# Patient Record
Sex: Male | Born: 1970 | Race: White | Hispanic: No | Marital: Married | Smoking: Never smoker
Health system: Southern US, Community
[De-identification: ages and names within clinical notes are randomized; demographics above are authoritative.]

## PROBLEM LIST (undated history)

## (undated) ENCOUNTER — Emergency Department: Admission: EM | Discharge status: Absent without leave | Payer: Self-pay

---

## 2017-07-05 ENCOUNTER — Emergency Department (HOSPITAL_COMMUNITY)
Admission: EM | Admit: 2017-07-05 | Discharge: 2017-07-05 | Disposition: A | Discharge status: Home / Self Care | Payer: Managed Care, Other (non HMO) | Attending: Emergency Medicine | Admitting: Emergency Medicine

## 2017-07-05 ENCOUNTER — Emergency Department (HOSPITAL_COMMUNITY): Payer: Managed Care, Other (non HMO)

## 2017-07-05 ENCOUNTER — Encounter (HOSPITAL_COMMUNITY): Payer: Self-pay | Admitting: Emergency Medicine

## 2017-07-05 DIAGNOSIS — S169XXA Unspecified injury of muscle, fascia and tendon at neck level, initial encounter: Secondary | ICD-10-CM | POA: Diagnosis present

## 2017-07-05 DIAGNOSIS — S161XXA Strain of muscle, fascia and tendon at neck level, initial encounter: Secondary | ICD-10-CM | POA: Insufficient documentation

## 2017-07-05 DIAGNOSIS — Y999 Unspecified external cause status: Secondary | ICD-10-CM | POA: Insufficient documentation

## 2017-07-05 DIAGNOSIS — Y92410 Unspecified street and highway as the place of occurrence of the external cause: Secondary | ICD-10-CM | POA: Diagnosis not present

## 2017-07-05 DIAGNOSIS — Y9389 Activity, other specified: Secondary | ICD-10-CM | POA: Diagnosis not present

## 2017-07-05 MED ORDER — CYCLOBENZAPRINE HCL 10 MG PO TABS: 10.0000 mg | ORAL_TABLET | Freq: Two times a day (BID) | ORAL | 0 refills | Status: AC | PRN
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
## 2017-07-05 NOTE — ED Notes (Signed)
Patient transported to X-ray 

## 2017-07-05 NOTE — ED Notes (Signed)
Pt still iun xray
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
## 2020-04-21 ENCOUNTER — Encounter: Payer: Self-pay | Admitting: Emergency Medicine

## 2020-04-21 ENCOUNTER — Ambulatory Visit
Admission: EM | Admit: 2020-04-21 | Discharge: 2020-04-21 | Disposition: A | Discharge status: Home / Self Care | Payer: Managed Care, Other (non HMO) | Attending: Emergency Medicine | Admitting: Emergency Medicine

## 2020-04-21 ENCOUNTER — Other Ambulatory Visit: Payer: Self-pay

## 2020-04-21 DIAGNOSIS — U071 COVID-19: Secondary | ICD-10-CM

## 2020-04-21 LAB — POC SARS CORONAVIRUS 2 AG -  ED: SARS Coronavirus 2 Ag: POSITIVE — AB

## 2020-04-21 MED ORDER — PREDNISONE 10 MG PO TABS: 20.0000 mg | ORAL_TABLET | Freq: Every day | ORAL | 0 refills | Status: DC

## 2020-04-21 MED ORDER — BENZONATATE 100 MG PO CAPS: 100.0000 mg | ORAL_CAPSULE | Freq: Three times a day (TID) | ORAL | 0 refills | Status: AC

## 2020-04-21 NOTE — Discharge Instructions (Addendum)
COVID-19 test result was positive.  This means you are infected with COVID-19 virus  You should remain isolated in your home for 10 days from symptom onset AND greater than 24 hours after symptoms resolution (absence of fever without the use of fever-reducing medication and improvement in respiratory symptoms), whichever is longer Get plenty of rest and push fluids Tessalon Perles prescribed for cough Low-dose prednisone was prescribed Use medications daily for symptom relief Use OTC medications like ibuprofen or tylenol as needed fever or pain Call or go to the ED if you have any new or worsening symptoms such as fever, worsening cough, shortness of breath, chest tightness, chest pain, turning blue, changes in mental status, etc..Marland Kitchen

## 2020-04-21 NOTE — ED Triage Notes (Signed)
Pt here for cough and SOB x 3 days after having GI sx; pt appears labored and O2 87% on RA

## 2020-04-21 NOTE — ED Provider Notes (Addendum)
[facility]    CSN: [id number] Arrival date & time: 04/21/20  0801      History   Chief Complaint Chief Complaint  Patient presents with  . Shortness of Breath    HPI Donald Dominguez is a 49 y.o. male.   Who presented to the urgent care for complaint of cough, shortness of breath, nausea, vomiting and diarrhea for the past 3 days.  Denies sick exposure to COVID, flu or strep.  Denies recent travel.  Denies aggravating or alleviating symptoms.  Denies previous COVID infection.   Denies fever, chills, fatigue, nasal congestion, rhinorrhea, sore throat, wheezing, chest pain,  changes in bladder habits.    The history is provided by the patient. No language interpreter was used.    History reviewed. No pertinent past medical history.  There are no problems to display for this patient.   History reviewed. No pertinent surgical history.     Home Medications    Prior to Admission medications   Medication Sig Start Date End Date Taking? Authorizing Provider  benzonatate (TESSALON) 100 MG capsule Take 1 capsule (100 mg total) by mouth every 8 (eight) hours. 04/21/20   Michaelangelo Mittelman, Zachery Dakins, FNP  cyclobenzaprine (FLEXERIL) 10 MG tablet Take 1 tablet (10 mg total) by mouth 2 (two) times daily as needed for muscle spasms. Patient not taking: Reported on 04/21/2020 07/05/17   McDonald, Pedro Earls A, PA-C  predniSONE (DELTASONE) 10 MG tablet Take 2 tablets (20 mg total) by mouth daily. 04/21/20   Floyce Bujak, Zachery Dakins, FNP    Family History History reviewed. No pertinent family history.  Social History Social History   Tobacco Use  . Smoking status: Never Smoker  . Smokeless tobacco: Never Used  Substance Use Topics  . Alcohol use: No  . Drug use: No     Allergies   Patient has no known allergies.   Review of Systems Review of Systems  Constitutional: Positive for fever.  HENT: Negative.   Respiratory: Positive for cough and shortness of breath.   Cardiovascular:  Negative.   Gastrointestinal: Positive for diarrhea, nausea and vomiting.  Neurological: Negative.   All other systems reviewed and are negative.    Physical Exam Triage Vital Signs ED Triage Vitals  Enc Vitals Group     BP      Pulse      Resp      Temp      Temp src      SpO2      Weight      Height      Head Circumference      Peak Flow      Pain Score      Pain Loc      Pain Edu?      Excl. in [place]?    No data found.  Updated Vital Signs BP 121/65 (BP Location: Right Arm)   Pulse (!) 113   Temp 99.6 F (37.6 C) (Oral)   Resp (!) 22   SpO2 (!) 87%   Visual Acuity Right Eye Distance:   Left Eye Distance:   Bilateral Distance:    Right Eye Near:   Left Eye Near:    Bilateral Near:     Physical Exam Vitals and nursing note reviewed.  Constitutional:      General: He is not in acute distress.    Appearance: Normal appearance. He is normal weight. He is not ill-appearing or toxic-appearing.  HENT:     Head:  Normocephalic.     Right Ear: Tympanic membrane, ear canal and external ear normal. There is no impacted cerumen.     Left Ear: Tympanic membrane, ear canal and external ear normal. There is no impacted cerumen.     Nose: Nose normal. No congestion.     Mouth/Throat:     Mouth: Mucous membranes are moist.     Pharynx: Oropharynx is clear. No oropharyngeal exudate or posterior oropharyngeal erythema.  Cardiovascular:     Rate and Rhythm: Regular rhythm. Tachycardia present.     Pulses: Normal pulses.     Heart sounds: Normal heart sounds. No murmur.  Pulmonary:     Effort: Pulmonary effort is normal. Tachypnea present. No respiratory distress.     Breath sounds: Normal breath sounds. No wheezing or rhonchi.  Chest:     Chest wall: No tenderness.  Neurological:     Mental Status: He is alert and oriented to person, place, and time.      UC Treatments / Results  Labs (all labs ordered are listed, but only abnormal results are displayed) Labs  Reviewed  POC SARS CORONAVIRUS 2 AG -  ED - Abnormal; Notable for the following components:      Result Value   SARS Coronavirus 2 Ag Positive (*)    All other components within normal limits    EKG   Radiology No results found.  Procedures Procedures (including critical care time)  Medications Ordered in UC Medications - No data to display  Initial Impression / Assessment and Plan / UC Course  I have reviewed the triage vital signs and the nursing notes.  Pertinent labs & imaging results that were available during my care of the patient were reviewed by me and considered in my medical decision making (see chart for details).    POCT COVID-19 test was positive.  Advised to quarantine.  To go to ED for worsening of symptoms.  Fin.al Clinical Impressions(s) / UC Diagnoses   Final diagnoses:  [id number] virus infection     Discharge Instructions     COVID-19 test result was positive.  This means you are infected with COVID-19 virus  You should remain isolated in your home for 10 days from symptom onset AND greater than 24 hours after symptoms resolution (absence of fever without the use of fever-reducing medication and improvement in respiratory symptoms), whichever is longer Get plenty of rest and push fluids Tessalon Perles prescribed for cough Low-dose prednisone was prescribed Use medications daily for symptom relief Use OTC medications like ibuprofen or tylenol as needed fever or pain Call or go to the ED if you have any new or worsening symptoms such as fever, worsening cough, shortness of breath, chest tightness, chest pain, turning blue, changes in mental status, etc...     ED Prescriptions    Medication Sig Dispense Auth. Provider   benzonatate (TESSALON) 100 MG capsule Take 1 capsule (100 mg total) by mouth every 8 (eight) hours. 30 capsule Markitta Ausburn S, FNP   predniSONE (DELTASONE) 10 MG tablet Take 2 tablets (20 mg total) by mouth daily. 15 tablet  Mekisha Bittel, Darrelyn Hillock, FNP     PDMP not reviewed this encounter.   Emerson Monte, FNP 04/21/20 0841    Emerson Monte, FNP 04/21/20 0843    Emerson Monte, FNP 04/21/20 [phone]

## 2020-04-22 ENCOUNTER — Telehealth: Payer: Self-pay | Admitting: Unknown Physician Specialty

## 2020-04-22 ENCOUNTER — Telehealth: Payer: Self-pay | Admitting: Nurse Practitioner

## 2020-04-22 NOTE — Telephone Encounter (Signed)
Called to discuss with patient about Covid symptoms and the use of bamlanivimab, a monoclonal antibody infusion for those with mild to moderate Covid symptoms and at a high risk of hospitalization.  Pt is qualified for this infusion at the [facility] infusion center due to BMI>35   Unable to leave message as voice mail not set up yet

## 2020-04-22 NOTE — Telephone Encounter (Signed)
Called to discuss with Donald Dominguez about Covid symptoms and the use of  bamlanivimab/etesevimab or casirivimab/imdevimab, a combination monoclonal antibody infusion for those with mild to moderate Covid symptoms and at a high risk of hospitalization.     Pt is qualified for this infusion at the [facility] infusion center due to co-morbid conditions and/or a member of an at-risk group, however declines infusion at this time.  Complains of shortness of breath and cough, howeve continues to decline infusion. Symptoms tier reviewed as well as criteria for ending isolation.  Symptoms reviewed that would warrant ED/Hospital evaluation. Preventative practices reviewed. Patient verbalized understanding.  Patient advised to call back if he decides that he does want to get infusion. Callback number to the infusion center given. Patient advised to go to Urgent care or ED with severe symptoms. Last date he would be eligible for infusion is 04/26/20.    Willette Alma, AGPCNP-BC Pager: [phone] Amion: Thea Alken

## 2020-04-30 ENCOUNTER — Encounter: Payer: Self-pay | Admitting: Emergency Medicine

## 2020-04-30 ENCOUNTER — Other Ambulatory Visit: Payer: Self-pay

## 2020-04-30 ENCOUNTER — Ambulatory Visit
Admission: EM | Admit: 2020-04-30 | Discharge: 2020-04-30 | Disposition: A | Discharge status: Home / Self Care | Payer: Managed Care, Other (non HMO) | Attending: Emergency Medicine | Admitting: Emergency Medicine

## 2020-04-30 DIAGNOSIS — U071 COVID-19: Secondary | ICD-10-CM

## 2020-04-30 DIAGNOSIS — R0602 Shortness of breath: Secondary | ICD-10-CM

## 2020-04-30 MED ORDER — PREDNISONE 10 MG PO TABS: 20.0000 mg | ORAL_TABLET | Freq: Every day | ORAL | 0 refills | Status: AC

## 2020-04-30 MED ORDER — ALBUTEROL SULFATE HFA 108 (90 BASE) MCG/ACT IN AERS: 1.0000 | INHALATION_SPRAY | Freq: Four times a day (QID) | RESPIRATORY_TRACT | 0 refills | Status: DC | PRN

## 2020-04-30 NOTE — ED Provider Notes (Signed)
[facility]    CSN: [id number] Arrival date & time: 04/30/20  1441      History   Chief Complaint Chief Complaint  Patient presents with   Shortness of Breath    HPI Donald Dominguez is a 49 y.o. male.   Presented to the urgent care for complaint of shortness of breath has not been resolved since he has been diagnosed with Covid infection.  Still he is unable to restart work after he has completed his 10 days.  States today is 10 days since the onset of his symptoms.  Denies sick exposure to  flu or strep.  Denies recent travel.  Denies aggravating or alleviating symptoms.  Has tried Tessalon, and prednisone with relief.  Denies fever, chills, fatigue, nasal congestion, rhinorrhea, sore throat, cough, wheezing, chest pain, nausea, vomiting, changes in bowel or bladder habits.    The history is provided by the patient. No language interpreter was used.  Shortness of Breath   History reviewed. No pertinent past medical history.  There are no problems to display for this patient.   History reviewed. No pertinent surgical history.     Home Medications    Prior to Admission medications   Medication Sig Start Date End Date Taking? Authorizing Provider  albuterol (VENTOLIN HFA) 108 (90 Base) MCG/ACT inhaler Inhale 1-2 puffs into the lungs every 6 (six) hours as needed for wheezing or shortness of breath. 04/30/20   Kylina Vultaggio, Darrelyn Hillock, FNP  benzonatate (TESSALON) 100 MG capsule Take 1 capsule (100 mg total) by mouth every 8 (eight) hours. 04/21/20   Ina Scrivens, Darrelyn Hillock, FNP  cyclobenzaprine (FLEXERIL) 10 MG tablet Take 1 tablet (10 mg total) by mouth 2 (two) times daily as needed for muscle spasms. Patient not taking: Reported on 04/21/2020 07/05/17   McDonald, Maree Erie A, PA-C  predniSONE (DELTASONE) 10 MG tablet Take 2 tablets (20 mg total) by mouth daily. 04/30/20   Lilton Pare, Darrelyn Hillock, FNP    Family History No family history on file.  Social History Social History    Tobacco Use   Smoking status: Never Smoker   Smokeless tobacco: Never Used  Substance Use Topics   Alcohol use: No   Drug use: No     Allergies   Patient has no known allergies.   Review of Systems Review of Systems  Constitutional: Negative.   HENT: Negative.   Respiratory: Positive for shortness of breath.   Cardiovascular: Negative.   Gastrointestinal: Negative.   Neurological: Negative.   All other systems reviewed and are negative.    Physical Exam Triage Vital Signs ED Triage Vitals  Enc Vitals Group     BP 04/30/20 1611 122/77     Pulse Rate 04/30/20 1611 (!) 121     Resp 04/30/20 1611 20     Temp 04/30/20 1611 98.2 F (36.8 C)     Temp Source 04/30/20 1611 Oral     SpO2 04/30/20 1611 95 %     Weight 04/30/20 1614 280 lb (127 kg)     Height 04/30/20 1614 5\' 11"  (1.803 m)     Head Circumference --      Peak Flow --      Pain Score 04/30/20 1614 0     Pain Loc --      Pain Edu? --      Excl. in [place]? --    No data found.  Updated Vital Signs BP 122/77 (BP Location: Right Arm)    Pulse Marland Kitchen)  121    Temp 98.2 F (36.8 C) (Oral)    Resp 20    Ht 5\' 11"  (1.803 m)    Wt 280 lb (127 kg)    SpO2 95%    BMI 39.05 kg/m   Visual Acuity Right Eye Distance:   Left Eye Distance:   Bilateral Distance:    Right Eye Near:   Left Eye Near:    Bilateral Near:     Physical Exam Vitals and nursing note reviewed.  Constitutional:      General: He is not in acute distress.    Appearance: Normal appearance. He is normal weight. He is not ill-appearing, toxic-appearing or diaphoretic.  HENT:     Head: Normocephalic.     Right Ear: Tympanic membrane, ear canal and external ear normal. There is no impacted cerumen.     Left Ear: Tympanic membrane, ear canal and external ear normal. There is no impacted cerumen.     Nose: Nose normal. No congestion.     Mouth/Throat:     Mouth: Mucous membranes are moist.     Pharynx: Oropharynx is clear. No oropharyngeal  exudate or posterior oropharyngeal erythema.  Cardiovascular:     Rate and Rhythm: Regular rhythm. Tachycardia present.     Pulses: Normal pulses.     Heart sounds: Normal heart sounds. No murmur. No friction rub. No gallop.   Pulmonary:     Effort: Pulmonary effort is normal. No respiratory distress.     Breath sounds: Normal breath sounds. No stridor. No wheezing, rhonchi or rales.  Chest:     Chest wall: No tenderness.  Neurological:     Mental Status: He is alert and oriented to person, place, and time.      UC Treatments / Results  Labs (all labs ordered are listed, but only abnormal results are displayed) Labs Reviewed - No data to display  EKG   Radiology No results found.  Procedures Procedures (including critical care time)  Medications Ordered in UC Medications - No data to display  Initial Impression / Assessment and Plan / UC Course  I have reviewed the triage vital signs and the nursing notes.  Pertinent labs & imaging results that were available during my care of the patient were reviewed by me and considered in my medical decision making (see chart for details).    Patient is stable for discharge.  Will prescribe prednisone and albuterol inhaler for shortness of breath.  Work note was given.  Was advised to follow PCP.  Final Clinical Impressions(s) / UC Diagnoses   Final diagnoses:  SOB (shortness of breath)  COVID-19 virus infection     Discharge Instructions     Albuterol was prescribed for shortness of breath Short-term prednisone was prescribed Work note was given Advised to follow-up and establish care with PCP Return or go to ED for worsening of symptoms    ED Prescriptions    Medication Sig Dispense Auth. Provider   albuterol (VENTOLIN HFA) 108 (90 Base) MCG/ACT inhaler Inhale 1-2 puffs into the lungs every 6 (six) hours as needed for wheezing or shortness of breath. 18 g Sarabi Sockwell, S, FNP   predniSONE (DELTASONE) 10 MG tablet  Take 2 tablets (20 mg total) by mouth daily. 15 tablet Kynlie Jane, March Rummage, FNP     PDMP not reviewed this encounter.   Zachery Dakins, FNP 04/30/20 1705

## 2020-04-30 NOTE — ED Triage Notes (Addendum)
Pt gets short of breath when ambulating.  This is his 10th day into covid dx.  Pt is supposed to return to work tomorrow and reports he does not think he will be able to go back. Pt family member reports pt sats dropped to 82% at home with ambulation.

## 2020-04-30 NOTE — Discharge Instructions (Addendum)
Albuterol was prescribed for shortness of breath Short-term prednisone was prescribed Work note was given Advised to follow-up and establish care with PCP Return or go to ED for worsening of symptoms

## 2020-05-09 ENCOUNTER — Telehealth: Payer: Self-pay

## 2020-05-09 MED ORDER — ALBUTEROL SULFATE HFA 108 (90 BASE) MCG/ACT IN AERS: 1.0000 | INHALATION_SPRAY | Freq: Four times a day (QID) | RESPIRATORY_TRACT | 0 refills | Status: AC | PRN

## 2020-07-22 ENCOUNTER — Ambulatory Visit
Admission: EM | Admit: 2020-07-22 | Discharge: 2020-07-22 | Disposition: A | Discharge status: Home / Self Care | Payer: Managed Care, Other (non HMO) | Attending: Emergency Medicine | Admitting: Emergency Medicine

## 2020-07-22 DIAGNOSIS — Z1152 Encounter for screening for COVID-19: Secondary | ICD-10-CM

## 2020-07-22 NOTE — ED Triage Notes (Signed)
Pt here for covid test , no symptoms  

## 2020-07-23 LAB — NOVEL CORONAVIRUS, NAA: SARS-CoV-2, NAA: NOT DETECTED

## 2020-07-23 LAB — SARS-COV-2, NAA 2 DAY TAT
